# Patient Record
Sex: Female | Born: 1960 | Race: Black or African American | Hispanic: No | Marital: Single | Smoking: Never smoker
Health system: Southern US, Community
[De-identification: ages and names within clinical notes are randomized; demographics above are authoritative.]

## PROBLEM LIST (undated history)

## (undated) DIAGNOSIS — E039 Hypothyroidism, unspecified: Secondary | ICD-10-CM

## (undated) DIAGNOSIS — Z5189 Encounter for other specified aftercare: Secondary | ICD-10-CM

## (undated) DIAGNOSIS — T7840XA Allergy, unspecified, initial encounter: Secondary | ICD-10-CM

## (undated) DIAGNOSIS — N912 Amenorrhea, unspecified: Secondary | ICD-10-CM

## (undated) DIAGNOSIS — E538 Deficiency of other specified B group vitamins: Secondary | ICD-10-CM

## (undated) DIAGNOSIS — Z8742 Personal history of other diseases of the female genital tract: Secondary | ICD-10-CM

## (undated) DIAGNOSIS — D219 Benign neoplasm of connective and other soft tissue, unspecified: Secondary | ICD-10-CM

## (undated) DIAGNOSIS — D649 Anemia, unspecified: Secondary | ICD-10-CM

## (undated) HISTORY — DX: Hypothyroidism, unspecified: E03.9

## (undated) HISTORY — DX: Anemia, unspecified: D64.9

## (undated) HISTORY — PX: BREAST FIBROADENOMA SURGERY: SHX580

## (undated) HISTORY — PX: DENTAL SURGERY: SHX609

## (undated) HISTORY — PX: BREAST EXCISIONAL BIOPSY: SUR124

## (undated) HISTORY — DX: Deficiency of other specified B group vitamins: E53.8

## (undated) HISTORY — DX: Amenorrhea, unspecified: N91.2

## (undated) HISTORY — DX: Allergy, unspecified, initial encounter: T78.40XA

## (undated) HISTORY — DX: Personal history of other diseases of the female genital tract: Z87.42

## (undated) HISTORY — DX: Encounter for other specified aftercare: Z51.89

## (undated) HISTORY — DX: Benign neoplasm of connective and other soft tissue, unspecified: D21.9

---

## 1988-01-01 HISTORY — PX: BREAST FIBROADENOMA SURGERY: SHX580

## 2001-06-26 ENCOUNTER — Other Ambulatory Visit: Admission: RE | Admit: 2001-06-26 | Discharge: 2001-06-26 | Payer: Self-pay | Admitting: Obstetrics and Gynecology

## 2004-01-11 ENCOUNTER — Other Ambulatory Visit: Admission: RE | Admit: 2004-01-11 | Discharge: 2004-01-11 | Payer: Self-pay | Admitting: Obstetrics and Gynecology

## 2004-01-19 ENCOUNTER — Ambulatory Visit (HOSPITAL_COMMUNITY): Admission: RE | Admit: 2004-01-19 | Discharge: 2004-01-19 | Payer: Self-pay | Admitting: Obstetrics and Gynecology

## 2005-02-06 ENCOUNTER — Other Ambulatory Visit: Admission: RE | Admit: 2005-02-06 | Discharge: 2005-02-06 | Payer: Self-pay | Admitting: Obstetrics and Gynecology

## 2005-02-09 ENCOUNTER — Ambulatory Visit (HOSPITAL_COMMUNITY): Admission: RE | Admit: 2005-02-09 | Discharge: 2005-02-09 | Payer: Self-pay | Admitting: Obstetrics and Gynecology

## 2006-02-11 ENCOUNTER — Ambulatory Visit (HOSPITAL_COMMUNITY): Admission: RE | Admit: 2006-02-11 | Discharge: 2006-02-11 | Payer: Self-pay | Admitting: Obstetrics and Gynecology

## 2006-02-20 ENCOUNTER — Other Ambulatory Visit: Admission: RE | Admit: 2006-02-20 | Discharge: 2006-02-20 | Payer: Self-pay | Admitting: Obstetrics and Gynecology

## 2006-03-07 ENCOUNTER — Encounter: Admission: RE | Admit: 2006-03-07 | Discharge: 2006-03-07 | Payer: Self-pay | Admitting: Obstetrics and Gynecology

## 2007-02-07 ENCOUNTER — Observation Stay (HOSPITAL_COMMUNITY): Admission: AD | Admit: 2007-02-07 | Discharge: 2007-02-08 | Payer: Self-pay | Admitting: Obstetrics and Gynecology

## 2007-02-25 DIAGNOSIS — D649 Anemia, unspecified: Secondary | ICD-10-CM | POA: Insufficient documentation

## 2007-04-25 ENCOUNTER — Encounter: Admission: RE | Admit: 2007-04-25 | Discharge: 2007-04-25 | Payer: Self-pay | Admitting: Obstetrics and Gynecology

## 2009-03-22 ENCOUNTER — Encounter: Admission: RE | Admit: 2009-03-22 | Discharge: 2009-03-22 | Payer: Self-pay | Admitting: Obstetrics and Gynecology

## 2010-03-15 DIAGNOSIS — N939 Abnormal uterine and vaginal bleeding, unspecified: Secondary | ICD-10-CM | POA: Insufficient documentation

## 2010-03-31 ENCOUNTER — Encounter: Admission: RE | Admit: 2010-03-31 | Discharge: 2010-03-31 | Payer: Self-pay | Admitting: Obstetrics and Gynecology

## 2010-04-12 ENCOUNTER — Encounter: Admission: RE | Admit: 2010-04-12 | Discharge: 2010-04-12 | Payer: Self-pay | Admitting: Obstetrics and Gynecology

## 2010-10-13 ENCOUNTER — Encounter: Admission: RE | Admit: 2010-10-13 | Discharge: 2010-10-13 | Payer: Self-pay | Admitting: Obstetrics and Gynecology

## 2011-01-21 ENCOUNTER — Encounter: Payer: Self-pay | Admitting: Obstetrics and Gynecology

## 2011-03-20 DIAGNOSIS — N912 Amenorrhea, unspecified: Secondary | ICD-10-CM | POA: Insufficient documentation

## 2011-03-20 DIAGNOSIS — Z8742 Personal history of other diseases of the female genital tract: Secondary | ICD-10-CM | POA: Insufficient documentation

## 2011-05-18 NOTE — Discharge Summary (Signed)
Theresa [name], Theresa [name]              ACCOUNT NO.:  [id number]   MEDICAL RECORD NO.:  [id number]          PATIENT TYPE:  OBV   LOCATION:  9399                          [facility]ILITY[facility]  [facility]   PHYSICIAN:  Hal Morales, M.D.DATE OF BIRTH:  Sep 17, 1961   DATE OF ADMISSION:  02/07/2007  DATE OF DISCHARGE:  02/08/2007                               DISCHARGE SUMMARY   DISCHARGE DIAGNOSES:  1. Menorrhagia.  2. Uterine fibroids.  3. Severe anemia (hemoglobin as low as 4).  4. s/p transfusion  5. s/p attempted Novasure endometrial ablation   OPERATION:  On hospital day #2, the patient underwent hysteroscopy with  an attempted endometrial ablation which could not be completed.  Due to  inability to document the integrity of the endometrial cavity upon the  use of the NovaSure Endometrial Ablation System, the procedure was  terminated.   HISTORY OF PRESENT ILLNESS:  Theresa [name] is a 50 year old single black  female para 1-0-0-1 with a history of uterine fibroids, menorrhagia and  severe anemia (on admission hemoglobin of 4) who presents for  hysteroscopy with endometrial ablation and transfusion of 4 units of  packed red blood cells.  Please see the patient's dictated history and  physical examination for details.   ADMISSION PHYSICAL EXAM:  GENERAL EXAM:  The patient was a well  developed black female in no acute distress.  General exam was within  normal limits, however, do note that the patient's heart exam revealed  tachycardia at a rate of 102 with a 2/6 systolic ejection murmur.  She  had no S3 heart sounds.  PELVIC:  EG, BUS was covered with blood.  The vagina had approximately  30 mL of blood in the vault with several clots.  The cervix was  nontender without lesions.  The uterus appeared upper limits of normal  size, posterior and firm.  Adnexa was without any tenderness or masses  and the rectovaginal exam was deferred.   HOSPITAL COURSE:  On the date of admission, the patient was  transfused 4  units of packed red blood cells bringing her hemoglobin of 4 to 8.4.   On hospital day #2, she underwent hysteroscopy with an attempted  NovaSure ablation procedure, however, it was terminated due to inability  to confirm uterine integrity.  The remainder of the patient's hospital  visit and postop period was unremarkable with patient being discharged  home on the afternoon of hospital day #2.   DISCHARGE MEDICATIONS:  1. Ibuprofen 600 mg every 6 hours as needed for pain.  2. Ferrous sulfate 1 tablet twice daily.  3. Ortho-Novum 1/35 one tablet daily.   FOLLOW UP:  The patient is to call [facility] to schedule a  postoperative visit with Dr. Pennie Rushing in 10-14 days.   DISCHARGE INSTRUCTIONS:  The patient the patient was given a copy of the  [facility] Instructions for Hysteroscopy.  She was also advised to avoid intercourse for 2 weeks.  The patient's  diet was to eat light diet on the evening of her discharge and resume a  regular diet the  following day.      Theresa [name].      Hal Morales, M.D.  Electronically Signed    EJP/MEDQ  D:  02/20/2007  T:  02/20/2007  Job:  [id number]

## 2011-05-18 NOTE — H&P (Signed)
[name], [name]              ACCOUNT NO.:  [id number]   MEDICAL RECORD NO.:  [id number]          PATIENT TYPE:  INP   LOCATION:  9310                          [facility]   PHYSICIAN:  Hal Morales, M.D.DATE OF BIRTH:  1961/03/21   DATE OF ADMISSION:  02/07/2007  DATE OF DISCHARGE:                              HISTORY & PHYSICAL   HISTORY OF PRESENT ILLNESS:  The patient is a 50 year old black, single  female para 1-0-0-1 who presented to [facility]  [facility] on February 06, 2007 complaining of  increasingly severe menorrhagia.  The patient had had normal cycles  through July, 2007.  She did not have menses in August.  She had a  normal menstrual cycle, which, for her, consists of 3 days of bleeding  in September.  In October she had no menses.  In November she had  bleeding from November 17 through December 24, off and on, sometimes  heavy with clots, sometimes changing pads as much as every 2 hours.  She  restarted bleeding on January 02, 2007 and continues bleeding through  today.  Again, the flow varies from light to fairly heavy.  Over the  last several days, it had been fairly light but on February 06, 2007 was  noted to be quite significant and she began feeling weak.  She called  and was seen for a same-day appointment.  At that time, she denied  dizziness.  She denied nausea, vomiting, constipation or diarrhea.  She  denied any specific pelvic pain although she was having rare menstrual  type cramps.  She is not sexually active and has not been for several  years.  She admits to a history of anemia in the past but states that  she checked her hemoglobin at the [facility] approximately a week ago and  it was read as 9 g of hemoglobin.  Her last hemoglobin in our office was  in November, 2005, at which time it was 12 g.  The patient does have a  gynecologic history of uterine fibroids and these have always been  asymptomatic.  The diagnosis was made in 2005.   MEDICAL HISTORY:  The patient does have hypothyroidism, for which she is  followed by Dr. Margaretmary Bayley and is currently taking Synthroid.  She  has had no change in her medication in the last year and her last TSH  was approximately a year ago.  She specifically denies fatigue prior to  her bleeding episode; however, she does now admit to fatigue.  She  denies any significant weight gain, leg swelling or constipation.  She  denies a history of diabetes or hypertension.   CURRENT MEDICATIONS:  1. Synthroid 150 mcg a day.  2. Iron 325 mg a day.   DRUG SENSITIVITIES:  No known drug allergies.   FAMILY HISTORY:  Positive for heart disease, epilepsy, chronic  hypertension, anemia, breast cancer.   PAST SURGICAL HISTORY:  1. Cesarean section in 1999.  2. Colonoscopy in 2000.  3. Removal of a right fibroadenoma in 1989.  GYNECOLOGIC PAST HISTORY:  The patient had an abnormal Pap smear in 1989  and underwent cold-knife conization of the cervix with subsequent normal  Pap smears.  Her last Pap smear was done in February, 2007 and was  normal.   REVIEW OF SYSTEMS:  Negative except as mentioned above.  Specifically,  the patient does not have any chest pain, shortness of breath,  palpitations, syncope or difficulty ambulating.   PHYSICAL EXAMINATION:  GENERAL:  The patient is a well-developed black  female in no acute distress.  HEENT:  Within normal limits.  The thyroid is not enlarged.  HEART:  Tachycardic with a resting heart rate of approximately 102 with  no significant orthostatic increase with sitting or standing.  She does  have a 2/6 systolic ejection murmur.  She has no S3.  LUNGS:  Clear.  ABDOMEN:  Soft without masses or organomegaly.  PELVIC:  External genitalia, Bartholin, urethra and Skene's are blood  covered.  The vagina has approximately 30 mL of blood in the vault with  several clots.  The cervix is nontender  without lesions.  The uterus is  upper limits of normal size, posterior and firm.  Adnexa, no masses or  tenderness.  RECTOVAGINAL:  Deferred.   LABORATORY STUDIES:  Urine analysis is negative except for blood.  Pregnancy test is negative.  Fingerstick hemoglobin initially read 3.4 g  and on second evaluation read 4 g.  Complete blood count confirmed the  hemoglobin of 4 g.  Ultrasound shows a uterus measuring 11. 9 cm x 6.3 x  8.1 cm.  The right and left ovaries are within normal limits.  The  endometrial thickness is 1.9 cm.  There is suggestion of a hyper-echoic  mass in the endometrial cavity with a single blood flow consistent with  a polyp measuring 1.5 x 1.2 cm.  There is a posterior intra-mural  fibroid measuring 3 x 2.4 x 2.4 cm.   IMPRESSION:  1. Menorrhagia.  2. Uterine fibroid.  3  Possible endometrial polyp.  1. Hypothyroidism.  2. Well-compensated anemia.   DISPOSITION:  1. A discussion was held with the patient concerning options for      management.  Transfusion is recommended and the patient accepts      transfusion and admission on 02/07/2007.  She will have a 4 unit      transfusion of packed red blood cells.  2. Premarin 25 mg IV q. 6 hours until bleeding stops.  3. Recommendation for D&C, possible endometrial ablation is offered      with the risks of anesthesia, bleeding, infection, damage to      adjacent organs and the possibility that because of her anatomic      defect she may not get optimal benefit from her endometrial      ablation.  She is likewise offered Mirena IUD, Depo-Provera and      hysterectomy.  The patient seems most interested in either      endometrial ablation or Mirena and will make a final decision about      that.  She did undergo an endometrial biopsy and that pathology is      pending.      Hal Morales, M.D.  Electronically Signed    VPH/MEDQ  D:  02/07/2007  T:  02/07/2007  Job:  [id number]

## 2011-05-18 NOTE — Op Note (Signed)
[name], [name]              ACCOUNT NO.:  [id number]   MEDICAL RECORD NO.:  [id number]          PATIENT TYPE:  INP   LOCATION:  9310                          [facility]ILITY:  [facility]   PHYSICIAN:  Hal Morales, M.D.DATE OF BIRTH:  10/12/1961   DATE OF PROCEDURE:  02/08/2007  DATE OF DISCHARGE:                               OPERATIVE REPORT   PREOPERATIVE DIAGNOSES:  Menorrhagia, abnormal uterine bleeding, uterine  fibroids, anemia, question of endometrial polyp.   POSTOPERATIVE DIAGNOSES:  Menorrhagia, abnormal uterine bleeding, anemia  and fibroid.   PROCEDURE:  Hysteroscopy, attempted endometrial ablation which could not  be was completed.   SURGEON:  Hal Morales, M.D.   ANESTHESIA:  General LMA.   ESTIMATED BLOOD LOSS:  Less than 25 mL.   COMPLICATIONS:  Due inability to document the integrity of the  endometrial cavity upon use of the Novasure, the endometrial ablation,  could not be completed.   DESCRIPTION OF PROCEDURE:  The patient was taken to the operating room,  after appropriate identification, placed on the operating table.  After  the attainment of adequate general anesthesia, she was placed in the  lithotomy position.  The perineum and vagina were prepped with multiple  layers of Betadine and a red Robinson catheter used to empty the  bladder.  The perineum was draped as a sterile field.  A Graves speculum  was placed in the vagina and a paracervical block achieved with a total  of 10 mL of 2% Xylocaine in the 5 and 7 o'clock positions.  The cervix  was grasped with a single tooth tenaculum and the cervix measured at 4  cm.  It was, however, noted that the cervix was quite dilated consistent  with the patient's history of bleeding for the last two months.  The  uterus sounded to 9 cm for a cavity length of 5 cm.  No further dilation  was required to utilize the diagnostic hysteroscope to visualize the  endometrial cavity.  The tubal ostia were  identified and the endometrium  was noted to be quite atrophic.  No endometrial lesions were noted,  specifically, there were no polyps.   The hysteroscope was then removed and the Novasure apparatus placed in  the endometrial cavity.  The array was deployed and upon seating the  array, it was noted that the cervix was dilated enough that the array  could not be held in the endometrial cavity.  Several maneuvers were  attempted to adequately close the cervical canal to allow placement of  the Novasure device, seating of the Novasure device, and then passage of  the cavity assessment.  In spite of additional tenacula placed on the  cervix, stitches placed in the cervix, and the use of Vaseline gauze to  try to create a seal at the cervix the cavity assessment could never be  passed.  In light of inability to document integrity of the cavity by  passing the cavity assessment, the procedure was abandoned and no  further global endometrial ablation was attempted.   All instruments were then removed from the vagina.  The  sutures that had  been placed in the cervix as a pursestring were removed and hemostasis  was noted to be adequate.  The patient was awakened from general  anesthesia, after  all instruments had been removed, and taken to the recovery room in  satisfactory condition having tolerated the procedure well with sponge  and instrument counts correct.  There were no specimens to pathology as  the patient had undergone an endometrial biopsy in the office.      Hal Morales, M.D.  Electronically Signed     VPH/MEDQ  D:  02/08/2007  T:  02/08/2007  Job:  [id number]

## 2012-02-20 DIAGNOSIS — N939 Abnormal uterine and vaginal bleeding, unspecified: Secondary | ICD-10-CM

## 2012-02-20 DIAGNOSIS — D649 Anemia, unspecified: Secondary | ICD-10-CM

## 2012-02-20 DIAGNOSIS — N912 Amenorrhea, unspecified: Secondary | ICD-10-CM

## 2012-02-20 DIAGNOSIS — Z8742 Personal history of other diseases of the female genital tract: Secondary | ICD-10-CM

## 2012-02-20 DIAGNOSIS — E538 Deficiency of other specified B group vitamins: Secondary | ICD-10-CM

## 2012-04-03 ENCOUNTER — Ambulatory Visit (INDEPENDENT_AMBULATORY_CARE_PROVIDER_SITE_OTHER): Payer: Commercial Indemnity | Admitting: Obstetrics and Gynecology

## 2012-04-03 DIAGNOSIS — Z01419 Encounter for gynecological examination (general) (routine) without abnormal findings: Secondary | ICD-10-CM

## 2012-04-03 DIAGNOSIS — N920 Excessive and frequent menstruation with regular cycle: Secondary | ICD-10-CM

## 2012-04-17 ENCOUNTER — Telehealth: Payer: Self-pay

## 2012-04-17 NOTE — Telephone Encounter (Signed)
PC TO PT PER TEST RESULTS. TOLD PT TSH-WNL. HGB 10.8. ADVISED TO TAKE IRON 325MG  DAILY PER VPH.  PT VOICES UNDERSTANDING.

## 2012-04-25 ENCOUNTER — Other Ambulatory Visit: Payer: Self-pay | Admitting: Obstetrics and Gynecology

## 2012-04-25 DIAGNOSIS — D249 Benign neoplasm of unspecified breast: Secondary | ICD-10-CM

## 2012-04-29 ENCOUNTER — Ambulatory Visit
Admission: RE | Admit: 2012-04-29 | Discharge: 2012-04-29 | Disposition: A | Discharge status: Home / Self Care | Payer: Commercial Indemnity | Source: Ambulatory Visit | Attending: Obstetrics and Gynecology | Admitting: Obstetrics and Gynecology

## 2012-04-29 ENCOUNTER — Other Ambulatory Visit: Payer: Self-pay | Admitting: Obstetrics and Gynecology

## 2012-04-29 DIAGNOSIS — D249 Benign neoplasm of unspecified breast: Secondary | ICD-10-CM

## 2012-05-07 NOTE — Progress Notes (Signed)
Tc to pt per imaging results. Pt has decided to monitor breast mass at this time as rec per report. Pt has already spoken to radiologist about her decision.

## 2013-12-21 ENCOUNTER — Other Ambulatory Visit: Payer: Self-pay | Admitting: Obstetrics and Gynecology

## 2013-12-21 DIAGNOSIS — D249 Benign neoplasm of unspecified breast: Secondary | ICD-10-CM

## 2013-12-30 ENCOUNTER — Other Ambulatory Visit: Payer: Commercial Indemnity

## 2017-05-02 ENCOUNTER — Other Ambulatory Visit: Payer: Self-pay | Admitting: Obstetrics and Gynecology

## 2017-05-02 DIAGNOSIS — R921 Mammographic calcification found on diagnostic imaging of breast: Secondary | ICD-10-CM

## 2017-05-06 ENCOUNTER — Other Ambulatory Visit: Payer: Self-pay | Admitting: Obstetrics and Gynecology

## 2017-05-06 ENCOUNTER — Ambulatory Visit
Admission: RE | Admit: 2017-05-06 | Discharge: 2017-05-06 | Disposition: A | Discharge status: Home / Self Care | Payer: Commercial Indemnity | Source: Ambulatory Visit | Attending: Obstetrics and Gynecology | Admitting: Obstetrics and Gynecology

## 2017-05-06 DIAGNOSIS — R921 Mammographic calcification found on diagnostic imaging of breast: Secondary | ICD-10-CM

## 2018-04-28 ENCOUNTER — Other Ambulatory Visit: Payer: Self-pay | Admitting: Obstetrics and Gynecology

## 2018-04-28 DIAGNOSIS — Z1231 Encounter for screening mammogram for malignant neoplasm of breast: Secondary | ICD-10-CM

## 2018-05-16 ENCOUNTER — Ambulatory Visit
Admission: RE | Admit: 2018-05-16 | Discharge: 2018-05-16 | Disposition: A | Discharge status: Home / Self Care | Payer: Managed Care, Other (non HMO) | Source: Ambulatory Visit | Attending: Obstetrics and Gynecology | Admitting: Obstetrics and Gynecology

## 2018-05-16 DIAGNOSIS — Z1231 Encounter for screening mammogram for malignant neoplasm of breast: Secondary | ICD-10-CM

## 2018-05-19 ENCOUNTER — Other Ambulatory Visit: Payer: Self-pay | Admitting: Obstetrics and Gynecology

## 2018-05-19 DIAGNOSIS — R928 Other abnormal and inconclusive findings on diagnostic imaging of breast: Secondary | ICD-10-CM

## 2018-05-21 ENCOUNTER — Ambulatory Visit
Admission: RE | Admit: 2018-05-21 | Discharge: 2018-05-21 | Disposition: A | Discharge status: Home / Self Care | Payer: Managed Care, Other (non HMO) | Source: Ambulatory Visit | Attending: Obstetrics and Gynecology | Admitting: Obstetrics and Gynecology

## 2018-05-21 DIAGNOSIS — R928 Other abnormal and inconclusive findings on diagnostic imaging of breast: Secondary | ICD-10-CM

## 2022-04-19 DIAGNOSIS — Z1231 Encounter for screening mammogram for malignant neoplasm of breast: Secondary | ICD-10-CM

## 2022-04-23 ENCOUNTER — Encounter: Payer: Self-pay | Admitting: Internal Medicine

## 2022-04-23 ENCOUNTER — Ambulatory Visit: Payer: BC Managed Care – PPO | Admitting: Internal Medicine

## 2022-04-23 ENCOUNTER — Encounter: Payer: Self-pay | Admitting: Gastroenterology

## 2022-04-23 VITALS — BP 130/90 | HR 67 | Temp 97.6°F | Ht 66.0 in | Wt 217.9 lb

## 2022-04-23 DIAGNOSIS — Z1211 Encounter for screening for malignant neoplasm of colon: Secondary | ICD-10-CM

## 2022-04-23 DIAGNOSIS — R030 Elevated blood-pressure reading, without diagnosis of hypertension: Secondary | ICD-10-CM

## 2022-04-23 DIAGNOSIS — Z6835 Body mass index (BMI) 35.0-35.9, adult: Secondary | ICD-10-CM

## 2022-04-23 DIAGNOSIS — E6609 Other obesity due to excess calories: Secondary | ICD-10-CM | POA: Diagnosis not present

## 2022-04-23 DIAGNOSIS — Z23 Encounter for immunization: Secondary | ICD-10-CM | POA: Diagnosis not present

## 2022-04-23 DIAGNOSIS — Z1231 Encounter for screening mammogram for malignant neoplasm of breast: Secondary | ICD-10-CM | POA: Diagnosis not present

## 2022-04-23 NOTE — Progress Notes (Signed)
? ? ?New Patient Office Visit ? ? ? ? ?This visit occurred during the SARS-CoV-2 public health emergency.  Safety protocols were in place, including screening questions prior to the visit, additional usage of staff PPE, and extensive cleaning of exam room while observing appropriate contact time as indicated for disinfecting solutions.  ? ? ?CC/Reason for Visit: Establish care ?Previous PCP: Jeanann Lewandowsky ?Last Visit: Years ? ?HPI: Theresa [name] is a 61 y.o. female who is coming in today for the above mentioned reasons. Past Medical History is significant for: Iron deficiency anemia due to uterine fibroids that has resolved since she has become menopausal.  She feels well and has no acute concerns or complaints.  She has a 28 year old daughter.  She does not smoke, she drinks alcohol only occasionally, no known drug allergies, her past surgical history is significant for a cesarean section and a left breast lumpectomy years ago.  Her family history significant for a father with hypertension.  She states that at one time she was diagnosed with hypothyroidism but has not been on medication in over 15 years as her thyroid levels had normalized.  She is overdue for COVID booster, flu, Tdap, shingles.  She is overdue for all age-appropriate cancer screening.  2 separate in office blood pressure measurements are 140/100 and 130/90 today. ? ? ?Past Medical/Surgical History: ?Past Medical History:  ?Diagnosis Date  ? Amenorrhea   ? hx of  ? Anemia   ? Fibroid   ? uterine, asymptomatic  ? H/O menorrhagia   ? Hypothyroidism   ? Vitamin B 12 deficiency   ? ? ?Past Surgical History:  ?Procedure Laterality Date  ? BREAST EXCISIONAL BIOPSY Left   ? [place]  ? right side  ? BREAST FIBROADENOMA SURGERY    ? [place]  ? ? ?Social History: ? reports that she has never smoked. She does not have any smokeless tobacco history on file. She reports current alcohol use. She reports that she does  not use drugs. ? ?Allergies: ?No Known Allergies ? ?Family History:  ?Family History  ?Problem Relation Age of Onset  ? Breast cancer Maternal Grandmother   ? ? ?No current outpatient medications on file. ? ?Review of Systems:  ?Constitutional: Denies fever, chills, diaphoresis, appetite change and fatigue.  ?HEENT: Denies photophobia, eye pain, redness, hearing loss, ear pain, congestion, sore throat, rhinorrhea, sneezing, mouth sores, trouble swallowing, neck pain, neck stiffness and tinnitus.   ?Respiratory: Denies SOB, DOE, cough, chest tightness,  and wheezing.   ?Cardiovascular: Denies chest pain, palpitations and leg swelling.  ?Gastrointestinal: Denies nausea, vomiting, abdominal pain, diarrhea, constipation, blood in stool and abdominal distention.  ?Genitourinary: Denies dysuria, urgency, frequency, hematuria, flank pain and difficulty urinating.  ?Endocrine: Denies: hot or cold intolerance, sweats, changes in hair or nails, polyuria, polydipsia. ?Musculoskeletal: Denies myalgias, back pain, joint swelling, arthralgias and gait problem.  ?Skin: Denies pallor, rash and wound.  ?Neurological: Denies dizziness, seizures, syncope, weakness, light-headedness, numbness and headaches.  ?Hematological: Denies adenopathy. Easy bruising, personal or family bleeding history  ?Psychiatric/Behavioral: Denies suicidal ideation, mood changes, confusion, nervousness, sleep disturbance and agitation ? ? ? ?Physical Exam: ?Vitals:  ? 04/23/22 1332 04/23/22 1339  ?BP: (!) 140/100 130/90  ?Pulse: 67   ?Temp: 97.6 ?F (36.4 ?C)   ?TempSrc: Oral   ?SpO2: 97%   ?Weight: 217 lb 14.4 oz (98.8 kg)   ?Height: '5\' 6"'$  (1.676 m)   ? ?Body mass index is 35.17 kg/m?. ? ? ?  Constitutional: NAD, calm, comfortable ?Eyes: PERRL, lids and conjunctivae normal, wears corrective lenses ?ENMT: Mucous membranes are moist.  ?Respiratory: clear to auscultation bilaterally, no wheezing, no crackles. Normal respiratory effort. No accessory muscle use.   ?Cardiovascular: Regular rate and rhythm, no murmurs / rubs / gallops. No extremity edema.  ?Neurologic: Grossly intact and nonfocal ?Psychiatric: Normal judgment and insight. Alert and oriented x 3. Normal mood.  ? ? ?Impression and Plan: ? ?Class 2 obesity due to excess calories without serious comorbidity with body mass index (BMI) of 35.0 to 35.9 in adult ?-Discussed healthy lifestyle, including increased physical activity and better food choices to promote weight loss. ? ?Screening for malignant neoplasm of colon ? - Plan: Ambulatory referral to Gastroenterology ? ?Encounter for screening mammogram for malignant neoplasm of breast ? - Plan: MM Digital Screening ? ?Elevated BP without diagnosis of hypertension ?-Blood pressure has been elevated in 2 separate measurements, she is not known to be hypertensive.  She will do ambulatory blood pressure monitoring and return for follow-up. ? ?Need for shingles vaccine ?-For shingles vaccine in office today. ? ?Time spent: 31 minutes reviewing chart, interviewing and examining patient and formulating plan of care. ? ? ? ?Patient Instructions  ?-Nice seeing you today!! ? ?-Schedule physical in 3 months. Come in fasting that day. ? ?-first shingles vaccine today. ? ?-Check your BP 2-3 times a week and bring in measurements to your next visit. ? ? ? ?Lelon Frohlich, MD ?[facility] ? ?

## 2022-04-23 NOTE — Addendum Note (Signed)
Addended byEncarnacion Slates on: 04/23/2022 02:09 PM ? ? Modules accepted: Orders ? ?

## 2022-04-23 NOTE — Patient Instructions (Signed)
-  Nice seeing you today!! ? ?-Schedule physical in 3 months. Come in fasting that day. ? ?-first shingles vaccine today. ? ?-Check your BP 2-3 times a week and bring in measurements to your next visit. ?

## 2022-04-30 ENCOUNTER — Ambulatory Visit
Admission: RE | Admit: 2022-04-30 | Discharge: 2022-04-30 | Disposition: A | Discharge status: Home / Self Care | Payer: BC Managed Care – PPO | Source: Ambulatory Visit | Attending: Internal Medicine | Admitting: Internal Medicine

## 2022-04-30 DIAGNOSIS — Z1231 Encounter for screening mammogram for malignant neoplasm of breast: Secondary | ICD-10-CM

## 2022-05-02 ENCOUNTER — Other Ambulatory Visit: Payer: Self-pay | Admitting: Internal Medicine

## 2022-05-02 DIAGNOSIS — R921 Mammographic calcification found on diagnostic imaging of breast: Secondary | ICD-10-CM

## 2022-05-12 ENCOUNTER — Other Ambulatory Visit: Payer: Self-pay | Admitting: Internal Medicine

## 2022-05-12 ENCOUNTER — Ambulatory Visit
Admission: RE | Admit: 2022-05-12 | Discharge: 2022-05-12 | Disposition: A | Discharge status: Home / Self Care | Payer: BC Managed Care – PPO | Source: Ambulatory Visit | Attending: Internal Medicine | Admitting: Internal Medicine

## 2022-05-12 DIAGNOSIS — N6489 Other specified disorders of breast: Secondary | ICD-10-CM | POA: Diagnosis not present

## 2022-05-12 DIAGNOSIS — N6320 Unspecified lump in the left breast, unspecified quadrant: Secondary | ICD-10-CM

## 2022-05-12 DIAGNOSIS — R921 Mammographic calcification found on diagnostic imaging of breast: Secondary | ICD-10-CM

## 2022-05-14 ENCOUNTER — Ambulatory Visit
Admission: RE | Admit: 2022-05-14 | Discharge: 2022-05-14 | Disposition: A | Discharge status: Home / Self Care | Payer: BC Managed Care – PPO | Source: Ambulatory Visit | Attending: Internal Medicine | Admitting: Internal Medicine

## 2022-05-14 DIAGNOSIS — N6320 Unspecified lump in the left breast, unspecified quadrant: Secondary | ICD-10-CM

## 2022-05-14 DIAGNOSIS — D0512 Intraductal carcinoma in situ of left breast: Secondary | ICD-10-CM | POA: Diagnosis not present

## 2022-05-14 DIAGNOSIS — N6321 Unspecified lump in the left breast, upper outer quadrant: Secondary | ICD-10-CM | POA: Diagnosis not present

## 2022-05-14 HISTORY — PX: BREAST BIOPSY: SHX20

## 2022-05-21 ENCOUNTER — Ambulatory Visit (AMBULATORY_SURGERY_CENTER): Payer: BC Managed Care – PPO | Admitting: *Deleted

## 2022-05-21 VITALS — Ht 66.0 in | Wt 209.0 lb

## 2022-05-21 DIAGNOSIS — Z1211 Encounter for screening for malignant neoplasm of colon: Secondary | ICD-10-CM

## 2022-05-21 MED ORDER — NA SULFATE-K SULFATE-MG SULF 17.5-3.13-1.6 GM/177ML PO SOLN: 1.0000 | Freq: Once | ORAL | 0 refills | Status: AC

## 2022-05-21 NOTE — Progress Notes (Signed)
No egg or soy allergy known to patient  No issues known to pt with past sedation with any surgeries or procedures Patient denies ever being told they had issues or difficulty with intubation  No FH of Malignant Hyperthermia Pt is not on diet pills Pt is not on  home 02  Pt is not on blood thinners  Pt denies issues with constipation  No A fib or A flutter    NO PA's for preps discussed with pt In PV today  Discussed with pt there will be an out-of-pocket cost for prep and that varies from $0 to 70 +  dollars - pt verbalized understanding  Pt instructed to use Singlecare.com or GoodRx for a price reduction on prep   PV completed over the phone. Pt verified name, DOB, address and insurance during PV today.   Pt encouraged to call with questions or issues.   If pt has My chart, procedure instructions sent via My Chart   Insurance confirmed with pt at [facility] today

## 2022-06-06 ENCOUNTER — Encounter: Payer: Self-pay | Admitting: Gastroenterology

## 2022-06-11 ENCOUNTER — Ambulatory Visit (AMBULATORY_SURGERY_CENTER): Payer: BC Managed Care – PPO | Admitting: Gastroenterology

## 2022-06-11 ENCOUNTER — Encounter: Payer: Self-pay | Admitting: Gastroenterology

## 2022-06-11 VITALS — BP 130/74 | HR 58 | Temp 96.2°F | Resp 14 | Ht 66.0 in | Wt 209.0 lb

## 2022-06-11 DIAGNOSIS — Z1211 Encounter for screening for malignant neoplasm of colon: Secondary | ICD-10-CM | POA: Diagnosis not present

## 2022-06-11 MED ORDER — SODIUM CHLORIDE 0.9 % IV SOLN: 500.0000 mL | Freq: Once | INTRAVENOUS | Status: DC

## 2022-06-11 NOTE — Progress Notes (Signed)
   Referring Provider: Isaac Bliss, [facility]* Primary Care Physician:  Isaac Bliss, Rayford Halsted, MD  Indication for Procedure:  Colon cancer screening   IMPRESSION:  Need for colon cancer screening Appropriate candidate for monitored anesthesia care  PLAN: Colonoscopy in the [place] today   HPI: Theresa [name] is a 61 y.o. female presents for screening colonoscopy.  No prior colonoscopy or colon cancer screening.  No baseline GI symptoms.   Mother with precancerous polyps. No other known family history of colon cancer or polyps. No family history of uterine/endometrial cancer, pancreatic cancer or gastric/stomach cancer.   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     oxyCODONE-acetaminophen (PERCOCET/ROXICET) 5-325 MG tablet Take 1 tablet by mouth every 4 (four) hours as needed. (Patient not taking: Reported on 05/21/2022)     Current Facility-Administered Medications  Medication Dose Route Frequency Provider Last Rate Last Admin   0.9 %  sodium chloride infusion  500 mL Intravenous Once Thornton Park, MD        Allergies as of 06/11/2022   (No Known Allergies)    Family  History  Problem Relation Age of Onset   Colon polyps Mother    Breast cancer Maternal Grandmother    Colon cancer Neg Hx    Esophageal cancer Neg Hx    Rectal cancer Neg Hx    Stomach cancer Neg Hx      Physical Exam: General:   Alert,  well-nourished, pleasant and cooperative in NAD Head:  Normocephalic and atraumatic. Eyes:  Sclera clear, no icterus.   Conjunctiva pink. Mouth:  No deformity or lesions.   Neck:  Supple; no masses or thyromegaly. Lungs:  Clear throughout to auscultation.   No wheezes. Heart:  Regular rate and rhythm; no murmurs. Abdomen:  Soft, non-tender, nondistended, normal bowel sounds, no rebound or guarding.  Msk:  Symmetrical. No boney deformities LAD: No inguinal or umbilical LAD Extremities:  No clubbing or edema. Neurologic:  Alert and  oriented x4;  grossly nonfocal Skin:  No obvious rash or bruise. Psych:  Alert and cooperative. Normal mood and affect.     Studies/Results: No results found.    Donnavan Covault L. Tarri Glenn, MD, MPH 06/11/2022, 8:28 AM

## 2022-06-11 NOTE — Progress Notes (Signed)
To pacu, VSS. Report to Rn.tb 

## 2022-06-11 NOTE — Patient Instructions (Signed)
Handout given on diverticulosis and high fiber diet.  Repeat colonoscopy in 5 years. Resume previous diet.    YOU HAD AN ENDOSCOPIC PROCEDURE TODAY AT [facility]:   Refer to the procedure report that was given to you for any specific questions about what was found during the examination.  If the procedure report does not answer your questions, please call your gastroenterologist to clarify.  If you requested that your care partner not be given the details of your procedure findings, then the procedure report has been included in a sealed envelope for you to review at your convenience later.  YOU SHOULD EXPECT: Some feelings of bloating in the abdomen. Passage of more gas than usual.  Walking can help get rid of the air that was put into your GI tract during the procedure and reduce the bloating. If you had a lower endoscopy (such as a colonoscopy or flexible sigmoidoscopy) you may notice spotting of blood in your stool or on the toilet paper. If you underwent a bowel prep for your procedure, you may not have a normal bowel movement for a few days.  Please Note:  You might notice some irritation and congestion in your nose or some drainage.  This is from the oxygen used during your procedure.  There is no need for concern and it should clear up in a day or so.  SYMPTOMS TO REPORT IMMEDIATELY:  Following lower endoscopy (colonoscopy or flexible sigmoidoscopy):  Excessive amounts of blood in the stool  Significant tenderness or worsening of abdominal pains  Swelling of the abdomen that is new, acute  Fever of 100F or higher   For urgent or emergent issues, a gastroenterologist can be reached at any hour by calling [phone]. Do not use MyChart messaging for urgent concerns.    DIET:  We do recommend a small meal at first, but then you may proceed to your regular diet.  Drink plenty of fluids but you should avoid alcoholic beverages for 24 hours.  ACTIVITY:  You should  plan to take it easy for the rest of today and you should NOT DRIVE or use heavy machinery until tomorrow (because of the sedation medicines used during the test).    FOLLOW UP: Our staff will call the number listed on your records 24-72 hours following your procedure to check on you and address any questions or concerns that you may have regarding the information given to you following your procedure. If we do not reach you, we will leave a message.  We will attempt to reach you two times.  During this call, we will ask if you have developed any symptoms of COVID 19. If you develop any symptoms (ie: fever, flu-like symptoms, shortness of breath, cough etc.) before then, please call [phone].  If you test positive for Covid 19 in the 2 weeks post procedure, please call and report this information to [place].    If any biopsies were taken you will be contacted by phone or by letter within the next 1-3 weeks.  Please call us at [phone] if you have not heard about the biopsies in 3 weeks.    SIGNATURES/CONFIDENTIALITY: You and/or your care partner have signed paperwork which will be entered into your electronic medical record.  These signatures attest to the fact that that the information above on your After Visit Summary has been reviewed and is understood.  Full responsibility of the confidentiality of this discharge information lies with you and/or  your care-partner.

## 2022-06-11 NOTE — Op Note (Addendum)
[place] Patient Name: Theresa [name] Procedure Date: 06/11/2022 8:54 AM MRN: [id number] Endoscopist: Thornton Park MD, MD Age: 61 Referring MD:  Date of Birth: July 25, 1961 Gender: Female Account #: [id number] Procedure:                Colonoscopy Indications:              Screening for colorectal malignant neoplasm, This                            is the patient's first colonoscopy                           Mother with precancerous polyps Medicines:                Monitored Anesthesia Care Procedure:                Pre-Anesthesia Assessment:                           - Prior to the procedure, a History and Physical                            was performed, and patient medications and                            allergies were reviewed. The patient's tolerance of                            previous anesthesia was also reviewed. The risks                            and benefits of the procedure and the sedation                            options and risks were discussed with the patient.                            All questions were answered, and informed consent                            was obtained. Prior Anticoagulants: The patient has                            taken no previous anticoagulant or antiplatelet                            agents. ASA Grade Assessment: II - A patient with                            mild systemic disease. After reviewing the risks                            and benefits, the patient was deemed in  satisfactory condition to undergo the procedure.                           After obtaining informed consent, the colonoscope                            was passed under direct vision. Throughout the                            procedure, the patient's blood pressure, pulse, and                            oxygen saturations were monitored continuously. The                            CF [id number] #[id number] was introduced  through the anus                            and advanced to the 3 cm into the ileum. A second                            forward view of the right colon was performed. The                            colonoscopy was performed without difficulty. The                            patient tolerated the procedure well. The quality                            of the bowel preparation was good. The terminal                            ileum, ileocecal valve, appendiceal orifice, and                            rectum were photographed. Scope In: 9:05:48 AM Scope Out: 9:21:36 AM Scope Withdrawal Time: 0 hours 10 minutes 46 seconds  Total Procedure Duration: 0 hours 15 minutes 48 seconds  Findings:                 The perianal and digital rectal examinations were                            normal.                           Multiple small and large-mouthed diverticula were                            found in the sigmoid colon, descending colon and                            ascending colon.  The exam was otherwise without abnormality on                            direct and retroflexion views except for internal                            hemorrhoids. Complications:            No immediate complications. Estimated Blood Loss:     Estimated blood loss: none. Impression:               - Diverticulosis in the sigmoid colon, in the                            descending colon and in the ascending colon.                           - Small internal hemorrhoids.                           - The examination was otherwise normal on direct                            and retroflexion views.                           - No specimens collected. Recommendation:           - Patient has a contact number available for                            emergencies. The signs and symptoms of potential                            delayed complications were discussed with the                             patient. Return to normal activities tomorrow.                            Written discharge instructions were provided to the                            patient.                           - Resume previous diet.                           - Continue present medications.                           - Repeat colonoscopy in 5 years for surveillance.                           - Follow a high fiber diet. Drink at least 64  ounces of water daily. Add a daily stool bulking                            agent such as psyllium (an exampled would be                            Metamucil).                           - Emerging evidence supports eating a diet of                            fruits, vegetables, grains, calcium, and yogurt                            while reducing red meat and alcohol may reduce the                            risk of colon cancer.                           - Thank you for allowing me to be involved in your                            colon cancer prevention. Thornton Park MD, MD 06/11/2022 9:26:49 AM This report has been signed electronically.

## 2022-06-11 NOTE — Progress Notes (Signed)
Pt's states no medical or surgical changes since previsit or office visit. 

## 2022-06-12 ENCOUNTER — Telehealth: Payer: Self-pay

## 2022-06-12 NOTE — Telephone Encounter (Signed)
  Follow up Call-     06/11/2022    8:05 AM  Call back number  Post procedure Call Back phone  # [phone]  Permission to leave phone message Yes     Patient questions:  Do you have a fever, pain , or abdominal swelling? No. Pain Score  0 *  Have you tolerated food without any problems? Yes.    Have you been able to return to your normal activities? Yes.    Do you have any questions about your discharge instructions: Diet   No. Medications  No. Follow up visit  No.  Do you have questions or concerns about your Care? No.  Actions: * If pain score is 4 or above: No action needed, pain <4.

## 2022-06-12 NOTE — Telephone Encounter (Signed)
  Follow up Call-     06/11/2022    8:05 AM  Call back number  Post procedure Call Back phone  # [phone]  Permission to leave phone message Yes     Patient questions:  Do you have a fever, pain , or abdominal swelling? No. Pain Score  0 *  Have you tolerated food without any problems? Yes.    Have you been able to return to your normal activities? Yes.    Do you have any questions about your discharge instructions: Diet   No. Medications  No. Follow up visit  No.  Do you have questions or concerns about your Care? No.  Actions: * If pain score is 4 or above: No action needed, pain <4.

## 2022-07-25 ENCOUNTER — Other Ambulatory Visit: Payer: Self-pay | Admitting: Internal Medicine

## 2022-07-25 ENCOUNTER — Encounter: Payer: Self-pay | Admitting: Internal Medicine

## 2022-07-25 ENCOUNTER — Other Ambulatory Visit (HOSPITAL_COMMUNITY)
Admission: RE | Admit: 2022-07-25 | Discharge: 2022-07-25 | Disposition: A | Discharge status: Home / Self Care | Payer: BC Managed Care – PPO | Source: Ambulatory Visit | Attending: Internal Medicine | Admitting: Internal Medicine

## 2022-07-25 ENCOUNTER — Ambulatory Visit (INDEPENDENT_AMBULATORY_CARE_PROVIDER_SITE_OTHER): Payer: BC Managed Care – PPO | Admitting: Internal Medicine

## 2022-07-25 VITALS — BP 110/80 | HR 79 | Temp 98.0°F | Ht 66.0 in | Wt 206.3 lb

## 2022-07-25 DIAGNOSIS — Z124 Encounter for screening for malignant neoplasm of cervix: Secondary | ICD-10-CM | POA: Insufficient documentation

## 2022-07-25 DIAGNOSIS — E1169 Type 2 diabetes mellitus with other specified complication: Secondary | ICD-10-CM

## 2022-07-25 DIAGNOSIS — E559 Vitamin D deficiency, unspecified: Secondary | ICD-10-CM | POA: Insufficient documentation

## 2022-07-25 DIAGNOSIS — Z23 Encounter for immunization: Secondary | ICD-10-CM | POA: Diagnosis not present

## 2022-07-25 DIAGNOSIS — Z0000 Encounter for general adult medical examination without abnormal findings: Secondary | ICD-10-CM | POA: Diagnosis not present

## 2022-07-25 DIAGNOSIS — E119 Type 2 diabetes mellitus without complications: Secondary | ICD-10-CM | POA: Insufficient documentation

## 2022-07-25 DIAGNOSIS — E538 Deficiency of other specified B group vitamins: Secondary | ICD-10-CM | POA: Diagnosis not present

## 2022-07-25 LAB — CBC WITH DIFFERENTIAL/PLATELET
Basophils Absolute: 0 10*3/uL (ref 0.0–0.1)
Basophils Relative: 0.6 % (ref 0.0–3.0)
Eosinophils Absolute: 0.1 10*3/uL (ref 0.0–0.7)
Eosinophils Relative: 2.3 % (ref 0.0–5.0)
HCT: 41.1 % (ref 36.0–46.0)
Hemoglobin: 13.5 g/dL (ref 12.0–15.0)
Lymphocytes Relative: 43.6 % (ref 12.0–46.0)
Lymphs Abs: 2.6 10*3/uL (ref 0.7–4.0)
MCHC: 32.8 g/dL (ref 30.0–36.0)
MCV: 88.5 fl (ref 78.0–100.0)
Monocytes Absolute: 0.3 10*3/uL (ref 0.1–1.0)
Monocytes Relative: 5.7 % (ref 3.0–12.0)
Neutro Abs: 2.9 10*3/uL (ref 1.4–7.7)
Neutrophils Relative %: 47.8 % (ref 43.0–77.0)
Platelets: 287 10*3/uL (ref 150.0–400.0)
RBC: 4.64 Mil/uL (ref 3.87–5.11)
RDW: 13 % (ref 11.5–15.5)
WBC: 6 10*3/uL (ref 4.0–10.5)

## 2022-07-25 LAB — COMPREHENSIVE METABOLIC PANEL
ALT: 10 U/L (ref 0–35)
AST: 13 U/L (ref 0–37)
Albumin: 3.8 g/dL (ref 3.5–5.2)
Alkaline Phosphatase: 87 U/L (ref 39–117)
BUN: 6 mg/dL (ref 6–23)
CO2: 30 mEq/L (ref 19–32)
Calcium: 8.9 mg/dL (ref 8.4–10.5)
Chloride: 106 mEq/L (ref 96–112)
Creatinine, Ser: 0.82 mg/dL (ref 0.40–1.20)
GFR: 78.24 mL/min (ref >60.00)
Glucose, Bld: 124 mg/dL — ABNORMAL HIGH (ref 70–99)
Potassium: 3.8 mEq/L (ref 3.5–5.1)
Sodium: 141 mEq/L (ref 135–145)
Total Bilirubin: 0.3 mg/dL (ref 0.2–1.2)
Total Protein: 7 g/dL (ref 6.0–8.3)

## 2022-07-25 LAB — TSH: TSH: 1.57 u[IU]/mL (ref 0.35–5.50)

## 2022-07-25 LAB — VITAMIN D 25 HYDROXY (VIT D DEFICIENCY, FRACTURES): VITD: 16.31 ng/mL — ABNORMAL LOW (ref 30.00–100.00)

## 2022-07-25 LAB — LIPID PANEL
Cholesterol: 201 mg/dL — ABNORMAL HIGH (ref 0–200)
HDL: 40 mg/dL (ref >39.00)
LDL Cholesterol: 140 mg/dL — ABNORMAL HIGH (ref 0–99)
NonHDL: 161.28
Total CHOL/HDL Ratio: 5
Triglycerides: 105 mg/dL (ref 0.0–149.0)
VLDL: 21 mg/dL (ref 0.0–40.0)

## 2022-07-25 LAB — VITAMIN B12: Vitamin B-12: 253 pg/mL (ref 211–911)

## 2022-07-25 LAB — HEMOGLOBIN A1C: Hgb A1c MFr Bld: 6.7 % — ABNORMAL HIGH (ref 4.6–6.5)

## 2022-07-25 MED ORDER — METFORMIN HCL 500 MG PO TABS: 500.0000 mg | ORAL_TABLET | Freq: Every day | ORAL | 1 refills | Status: AC

## 2022-07-25 MED ORDER — ATORVASTATIN CALCIUM 40 MG PO TABS: 40.0000 mg | ORAL_TABLET | Freq: Every day | ORAL | 1 refills | Status: AC

## 2022-07-25 MED ORDER — VITAMIN D (ERGOCALCIFEROL) 1.25 MG (50000 UNIT) PO CAPS: 50000.0000 [IU] | ORAL_CAPSULE | ORAL | 0 refills | Status: AC

## 2022-07-25 NOTE — Patient Instructions (Signed)
-  Nice seeing you today!!  -Lab work today; will notify you once results are available.  -Final shingles vaccine today.  -Remember COVID and tdap vaccines.  -Schedule follow up in 1 year or sooner as needed.

## 2022-07-25 NOTE — Addendum Note (Signed)
Addended by: Westley Hummer B on: 07/25/2022 07:59 AM   Modules accepted: Orders

## 2022-07-25 NOTE — Progress Notes (Signed)
Established Patient Office Visit     CC/Reason for Visit: Annual preventive exam  HPI: Theresa [name] is a 61 y.o. female who is coming in today for the above mentioned reasons. Past Medical History is significant for: Prior iron deficiency anemia due to bleeding from uterine fibroids that has resolved with menopause.  She is otherwise feeling well.  She has routine eye and dental care.  She has been walking.  She lost 13 pounds since I last saw her.  She is due for bivalent COVID, Tdap and second shingles vaccine.  She had mammogram and colonoscopy earlier this year, she is requesting Pap smear today.  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   Past Medical/Surgical History: Past Medical History:  Diagnosis Date   Allergy    mild- occ OTC meds   Amenorrhea    hx of   Anemia    Blood transfusion without reported diagnosis    3 units ~10-15 yrs ago due to fibroids   Fibroid    uterine, asymptomatic   H/O menorrhagia    Hypothyroidism    no meds- last check normal levels   Vitamin B 12 deficiency     Past Surgical History:  Procedure Laterality Date   BREAST BIOPSY  05/14/2022   mammary carcinoma in situ   BREAST EXCISIONAL BIOPSY Left    BREAST FIBROADENOMA SURGERY  1989   right side   CESAREAN SECTION  1999   DENTAL SURGERY      Social History:  reports that she has never smoked. She has never used smokeless tobacco. She reports current alcohol use. She reports that she does not use drugs.  Allergies: No Known Allergies  Family History:  Family History  Problem Relation Age of Onset   Colon polyps Mother    Breast cancer Maternal Grandmother    Colon cancer Neg Hx    Esophageal cancer Neg Hx    Rectal cancer Neg Hx    Stomach cancer Neg Hx      Current Outpatient Medications:    CALCIUM-MAGNESIUM-VITAMIN D PO, Liquid- 1 tbsp daily, Disp: , Rfl:    Multiple Vitamin (MULTIVITAMIN ADULT PO), Multivitamin, Disp: , Rfl:   Review of Systems:   Constitutional: Denies fever, chills, diaphoresis, appetite change and fatigue.  HEENT: Denies photophobia, eye pain, redness, hearing loss, ear pain, congestion, sore throat, rhinorrhea, sneezing, mouth sores, trouble swallowing, neck pain, neck stiffness and tinnitus.   Respiratory: Denies SOB, DOE, cough, chest tightness,  and wheezing.   Cardiovascular: Denies chest pain, palpitations and leg swelling.  Gastrointestinal: Denies nausea, vomiting, abdominal pain, diarrhea, constipation, blood in stool and abdominal distention.  Genitourinary: Denies dysuria, urgency, frequency, hematuria, flank pain and difficulty urinating.  Endocrine: Denies: hot or cold intolerance, sweats, changes in hair or nails, polyuria, polydipsia. Musculoskeletal: Denies myalgias, back pain, joint swelling, arthralgias and gait problem.  Skin: Denies pallor, rash and wound.  Neurological: Denies dizziness, seizures, syncope, weakness, light-headedness, numbness and headaches.  Hematological: Denies adenopathy. Easy bruising, personal or family bleeding history  Psychiatric/Behavioral: Denies suicidal ideation, mood changes, confusion, nervousness, sleep disturbance and agitation    Physical Exam: Vitals:   07/25/22 0700  BP: 110/80  Pulse: 79  Temp: 98 F (36.7 C)  TempSrc: Oral  SpO2: 99%  Weight: 206 lb 4.8 oz (93.6 kg)  Height: '5\' 6"'$  (1.676 m)    Body mass index is 33.3 kg/m.   Constitutional: NAD, calm, comfortable Eyes: PERRL, lids and conjunctivae normal  ENMT: Mucous membranes are moist. Posterior pharynx clear of any exudate or lesions. Normal dentition. Tympanic membrane is pearly white, no erythema or bulging. Neck: normal, supple, no masses, no thyromegaly Respiratory: clear to auscultation bilaterally, no wheezing, no crackles. Normal respiratory effort. No accessory muscle use.  Cardiovascular: Regular rate and rhythm, no murmurs / rubs / gallops. No extremity edema. 2+ pedal pulses. No  carotid bruits.  Abdomen: no tenderness, no masses palpated. No hepatosplenomegaly. Bowel sounds positive.  Musculoskeletal: no clubbing / cyanosis. No joint deformity upper and lower extremities. Good ROM, no contractures. Normal muscle tone.  Skin: no rashes, lesions, ulcers. No induration Neurologic: CN 2-12 grossly intact. Sensation intact, DTR normal. Strength 5/5 in all 4.  Psychiatric: Normal judgment and insight. Alert and oriented x 3. Normal mood.    Impression and Plan:  Encounter for preventive health examination  - Plan: CBC with Differential/Platelet, Comprehensive metabolic panel, Hemoglobin A1c, Lipid panel, TSH, VITAMIN D 25 Hydroxy (Vit-D Deficiency, Fractures) -Recommend routine eye and dental care. -Immunizations: Second shingles vaccine today, due for Tdap and bivalent COVID, declines today -Healthy lifestyle discussed in detail. -Labs to be updated today. 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 -Cervical cancer screening: Performed in office today -Lung cancer screening: Not applicable -Prostate cancer screening: Not applicable -DEXA: Not applicable  Vitamin P80 deficiency  - Plan: Vitamin B12  Need for shingles vaccine -Final shingles vaccine administered today     Patient Instructions  -Nice seeing you today!!  -Lab work today; will notify you once results are available.  -Final shingles vaccine today.  -Remember COVID and tdap vaccines.  -Schedule follow up in 1 year or sooner as needed.      Lelon Frohlich, MD [facility]

## 2022-07-30 LAB — CYTOLOGY - PAP
Comment: NEGATIVE
Diagnosis: NEGATIVE
High risk HPV: NEGATIVE

## 2022-09-21 ENCOUNTER — Ambulatory Visit: Payer: Self-pay | Admitting: Surgery

## 2022-09-21 DIAGNOSIS — L918 Other hypertrophic disorders of the skin: Secondary | ICD-10-CM | POA: Diagnosis not present

## 2022-09-21 DIAGNOSIS — D0502 Lobular carcinoma in situ of left breast: Secondary | ICD-10-CM | POA: Diagnosis not present

## 2022-09-27 ENCOUNTER — Other Ambulatory Visit: Payer: Self-pay | Admitting: Surgery

## 2022-09-27 DIAGNOSIS — D0500 Lobular carcinoma in situ of unspecified breast: Secondary | ICD-10-CM

## 2022-10-10 ENCOUNTER — Other Ambulatory Visit: Payer: Self-pay | Admitting: Internal Medicine

## 2022-10-10 DIAGNOSIS — E559 Vitamin D deficiency, unspecified: Secondary | ICD-10-CM

## 2022-10-13 ENCOUNTER — Ambulatory Visit
Admission: RE | Admit: 2022-10-13 | Discharge: 2022-10-13 | Disposition: A | Discharge status: Home / Self Care | Payer: BC Managed Care – PPO | Source: Ambulatory Visit | Attending: Surgery | Admitting: Surgery

## 2022-10-13 DIAGNOSIS — N6324 Unspecified lump in the left breast, lower inner quadrant: Secondary | ICD-10-CM | POA: Diagnosis not present

## 2022-10-13 DIAGNOSIS — N6323 Unspecified lump in the left breast, lower outer quadrant: Secondary | ICD-10-CM | POA: Diagnosis not present

## 2022-10-13 DIAGNOSIS — D0500 Lobular carcinoma in situ of unspecified breast: Secondary | ICD-10-CM

## 2022-10-13 MED ORDER — GADOPICLENOL 0.5 MMOL/ML IV SOLN
10.0000 mL | Freq: Once | INTRAVENOUS | Status: AC | PRN
  Administered 2022-10-13: 10 mL via INTRAVENOUS

## 2022-10-15 ENCOUNTER — Other Ambulatory Visit: Payer: Self-pay | Admitting: Internal Medicine

## 2022-10-15 DIAGNOSIS — E559 Vitamin D deficiency, unspecified: Secondary | ICD-10-CM

## 2022-10-18 ENCOUNTER — Other Ambulatory Visit: Payer: Self-pay | Admitting: Surgery

## 2022-10-18 DIAGNOSIS — R9389 Abnormal findings on diagnostic imaging of other specified body structures: Secondary | ICD-10-CM

## 2022-10-24 ENCOUNTER — Ambulatory Visit
Admission: RE | Admit: 2022-10-24 | Discharge: 2022-10-24 | Disposition: A | Discharge status: Home / Self Care | Payer: BC Managed Care – PPO | Source: Ambulatory Visit | Attending: Surgery | Admitting: Surgery

## 2022-10-24 ENCOUNTER — Other Ambulatory Visit (HOSPITAL_COMMUNITY): Payer: Self-pay | Admitting: Diagnostic Radiology

## 2022-10-24 DIAGNOSIS — N6325 Unspecified lump in the left breast, overlapping quadrants: Secondary | ICD-10-CM | POA: Diagnosis not present

## 2022-10-24 DIAGNOSIS — D0512 Intraductal carcinoma in situ of left breast: Secondary | ICD-10-CM | POA: Diagnosis not present

## 2022-10-24 DIAGNOSIS — R9389 Abnormal findings on diagnostic imaging of other specified body structures: Secondary | ICD-10-CM

## 2022-10-24 MED ORDER — GADOPICLENOL 0.5 MMOL/ML IV SOLN
10.0000 mL | Freq: Once | INTRAVENOUS | Status: AC | PRN
  Administered 2022-10-24: 10 mL via INTRAVENOUS

## 2022-10-29 ENCOUNTER — Ambulatory Visit: Payer: BC Managed Care – PPO | Admitting: Internal Medicine

## 2023-07-03 IMAGING — US US BREAST BX W LOC DEV 1ST LESION IMG BX SPEC US GUIDE*L*
1 series · 12 of 14 positions shown · non-contrast
Comparison: None Available.
COMPARISON: None Available.

Addendum:
CLINICAL DATA: Patient with an indeterminate mass in the LEFT
breast at the 2 o'clock axis presents today for ultrasound-guided
core biopsy.

EXAM:
ULTRASOUND GUIDED LEFT BREAST CORE NEEDLE BIOPSY

[Series 1: us breast bx w loc dev 1st lesion img bx spec us g · 0.07mm/px · 12 of 14 slices shown]
[im 1/14]
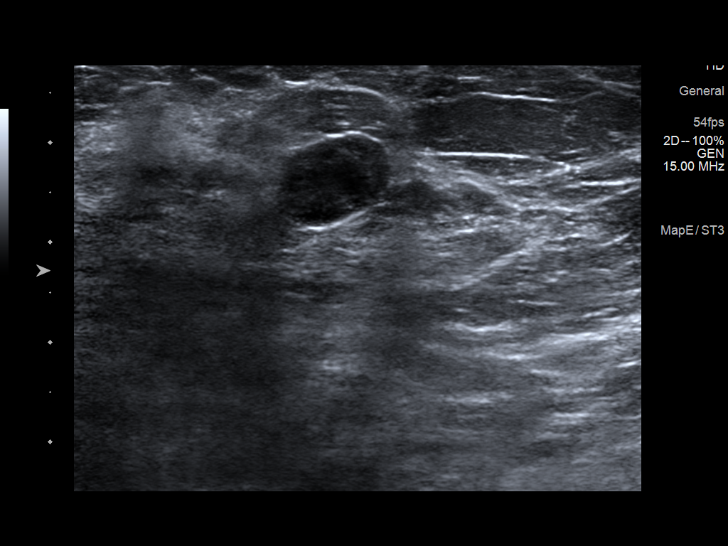
[im 2/14]
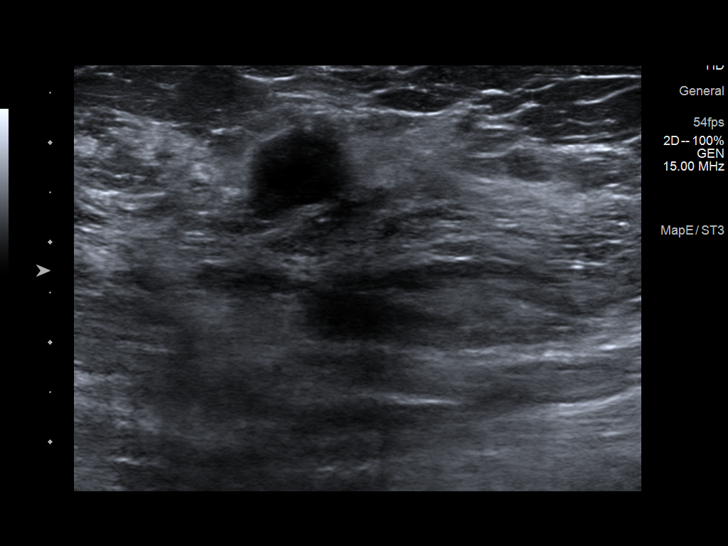
[im 3/14]
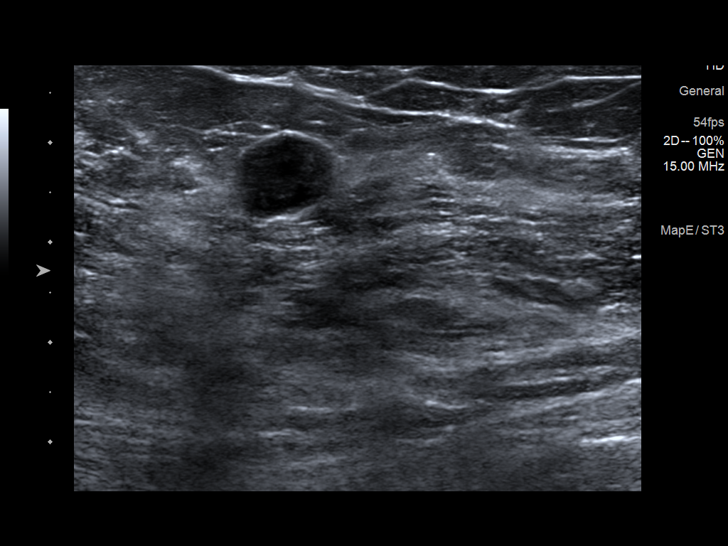
[im 5/14]
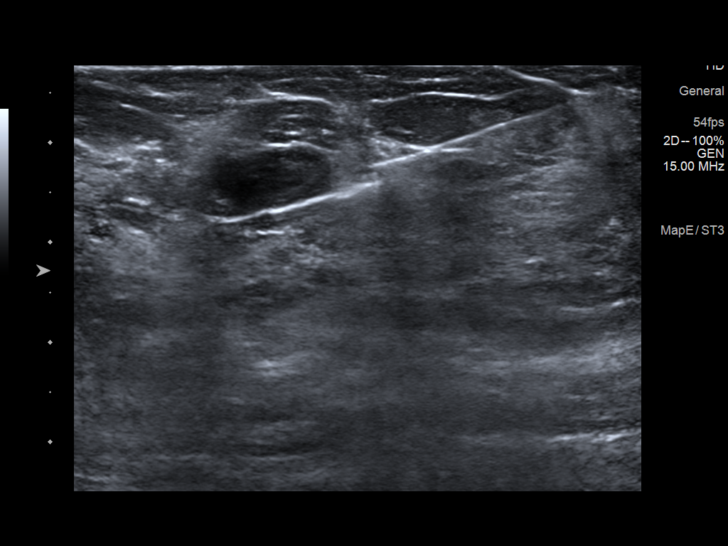
[im 6/14]
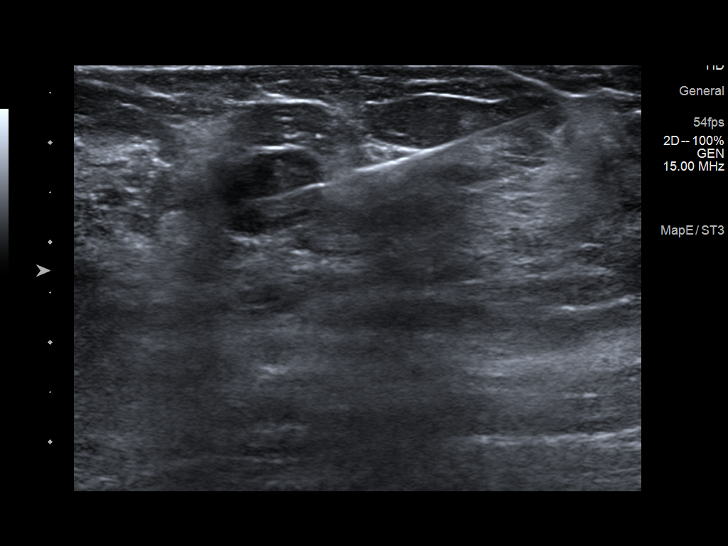
[im 7/14]
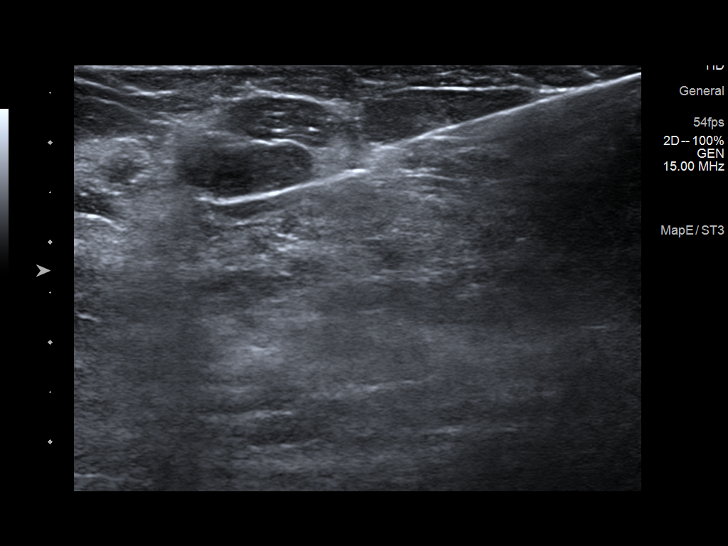
[im 8/14]
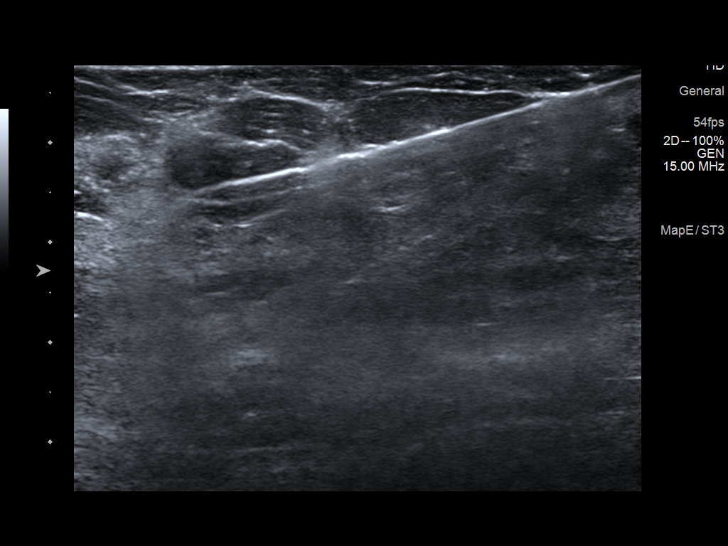
[im 9/14]
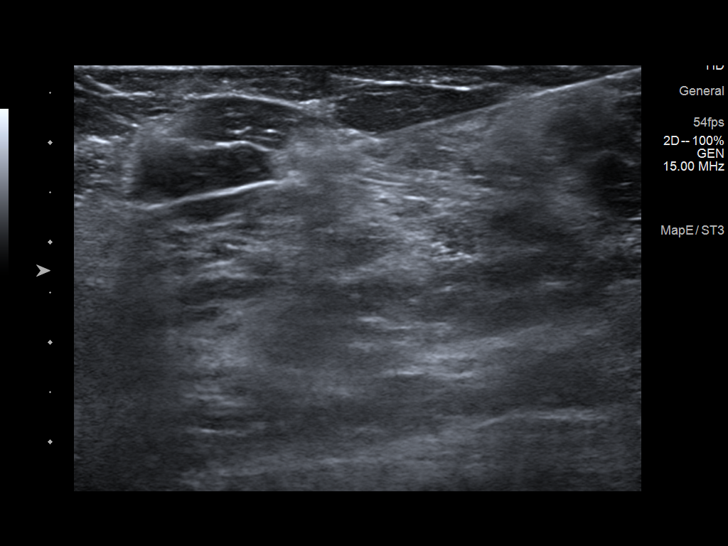
[im 10/14]
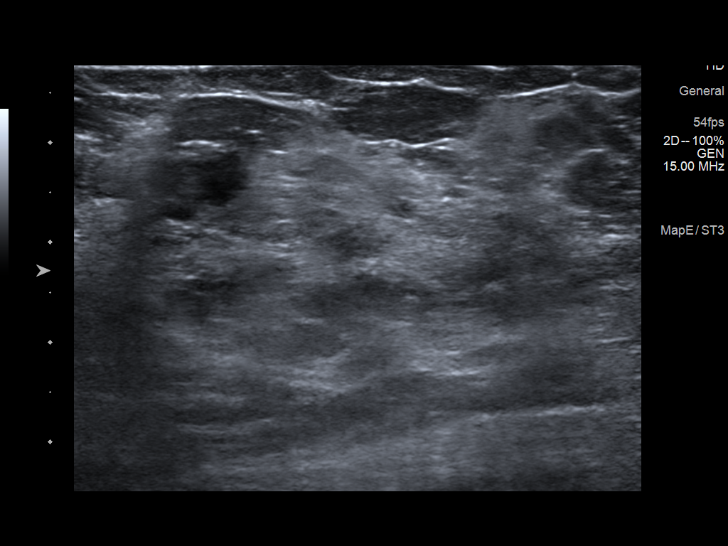
[im 12/14]
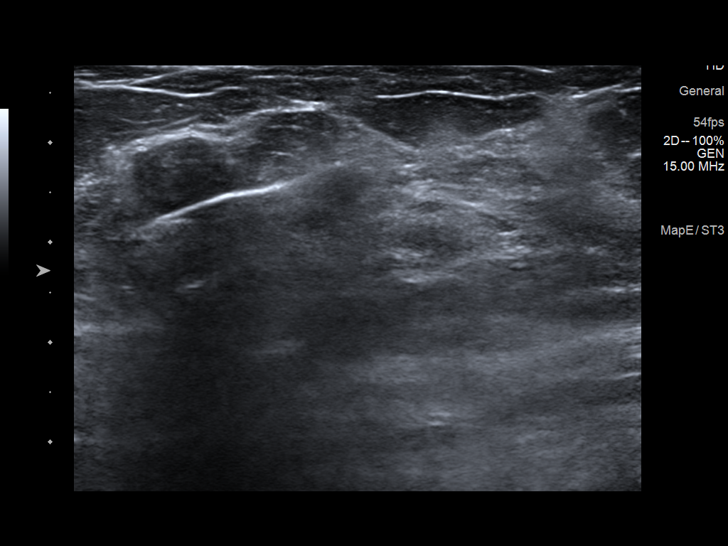
[im 13/14]
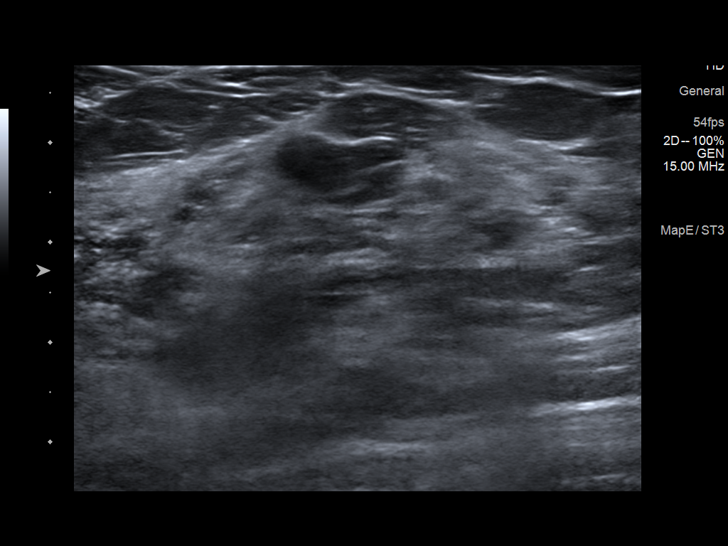
[im 14/14]
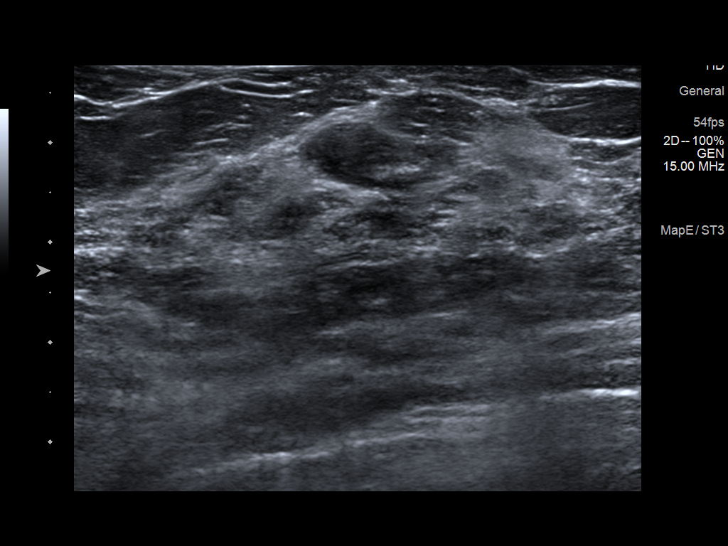

[12 of 14 positions shown; findings below may reference images not displayed]



Lesion quadrant: Upper outer quadrant

Using sterile technique and 1% Lidocaine as local anesthetic, under
direct ultrasound visualization, a 12 gauge Qosimov device was
used to perform biopsy of the LEFT breast mass at the 2 o'clock axis
using a inferolateral approach. At the conclusion of the procedure
ribbon shaped tissue marker clip was deployed into the biopsy
cavity. Follow up 2 view mammogram was performed and dictated
separately.
IMPRESSION: Ultrasound guided biopsy of the LEFT breast mass at the 2 o'clock
axis. No apparent complications.

ADDENDUM:
Pathology revealed MAMMARY CARCINOMA IN SITU, FIBROEPITHELIAL LESION
of the LEFT breast, 2 o'clock, 2 cmfn, (ribbon clip).
Immunohistochemical stain for E-cadherin is negative in the tumor
cells, consistent with a lobular phenotype Differential diagnosis
for fibroepithelial lesion includes a fibroadenoma and a fibrotic
intraductal papilloma. This was found to be concordant by Dr. Assistix
Farhadur, with excision recommended.

Pathology results were discussed with the patient by telephone. The
patient reported doing well after the biopsy with minimal tenderness
at the site. Post biopsy instructions and care were reviewed and
questions were answered. The patient was encouraged to call The

The patient wishes to discuss biopsy results and a surgical referral
with her physician before proceeding. The patient will contact me
when she is ready to proceed. My direct phone number was provided.

Consider MRI due to the complexity of the patient's breast tissue
and the multiplicity of sonographic findings. Stereotactic biopsy
would also be recommended for the LEFT breast posterior asymmetry
after the MRI.

Pathology results reported by Zilas Alge, RN on 05/23/2022.



Lesion quadrant: Upper outer quadrant

Using sterile technique and 1% Lidocaine as local anesthetic, under
direct ultrasound visualization, a 12 gauge Qosimov device was
used to perform biopsy of the LEFT breast mass at the 2 o'clock axis
using a inferolateral approach. At the conclusion of the procedure
ribbon shaped tissue marker clip was deployed into the biopsy
cavity. Follow up 2 view mammogram was performed and dictated
separately.
IMPRESSION: Ultrasound guided biopsy of the LEFT breast mass at the 2 o'clock
axis. No apparent complications.

## 2023-07-03 IMAGING — MG MM BREAST LOCALIZATION CLIP
4 series · 4 of 12 positions shown · non-contrast
Comparison: Previous exam(s).

CLINICAL DATA: Status post ultrasound-guided biopsy for a LEFT
breast mass at the 2 o'clock axis.

EXAM:
3D DIAGNOSTIC LEFT MAMMOGRAM POST ULTRASOUND BIOPSY

[L ML synth-2D]
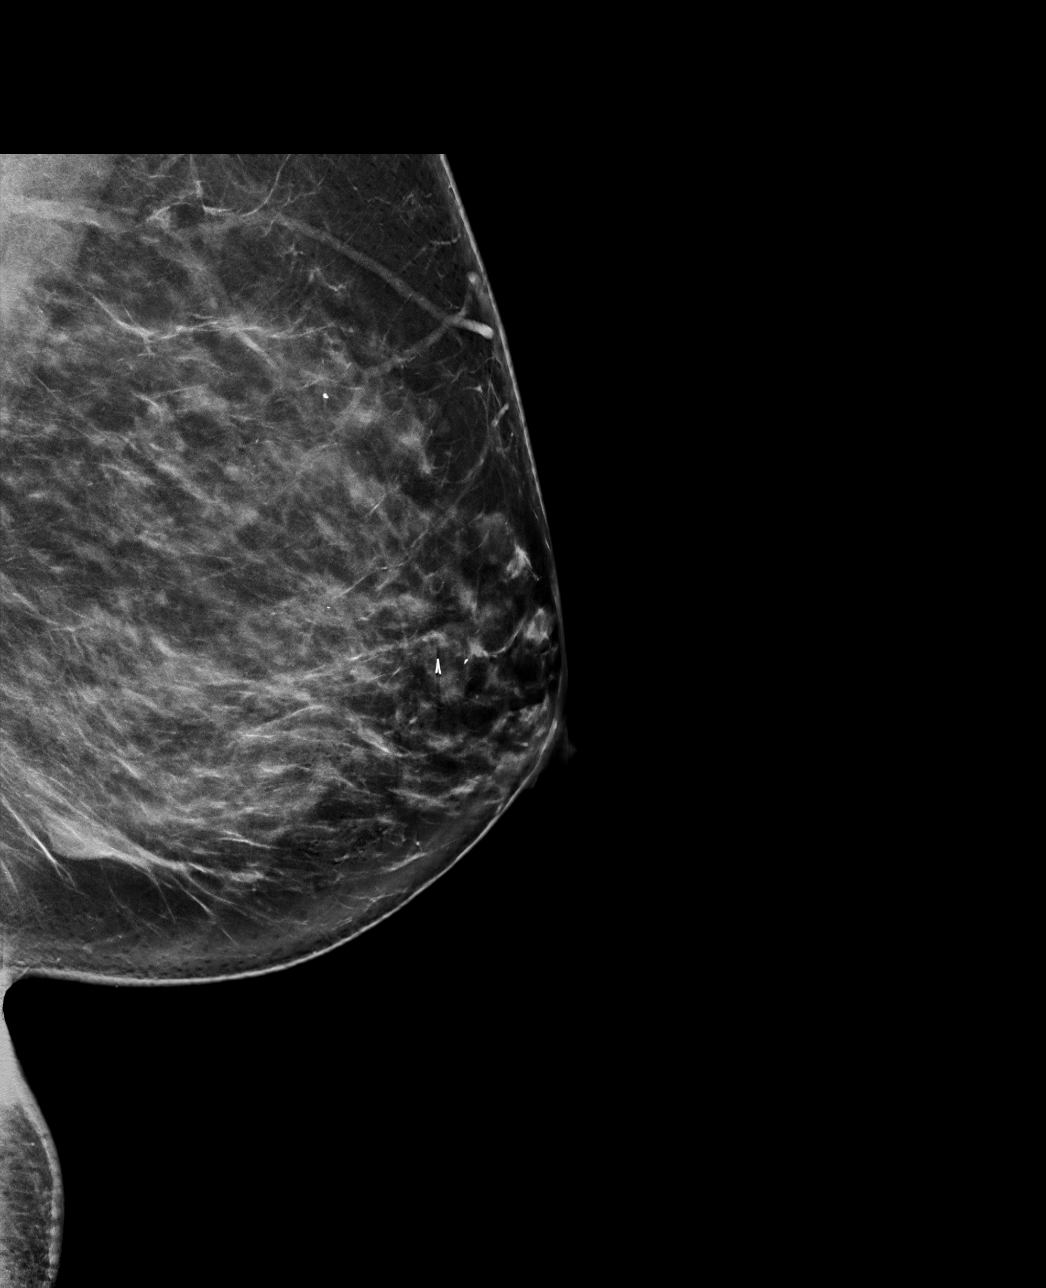

[L CC synth-2D]
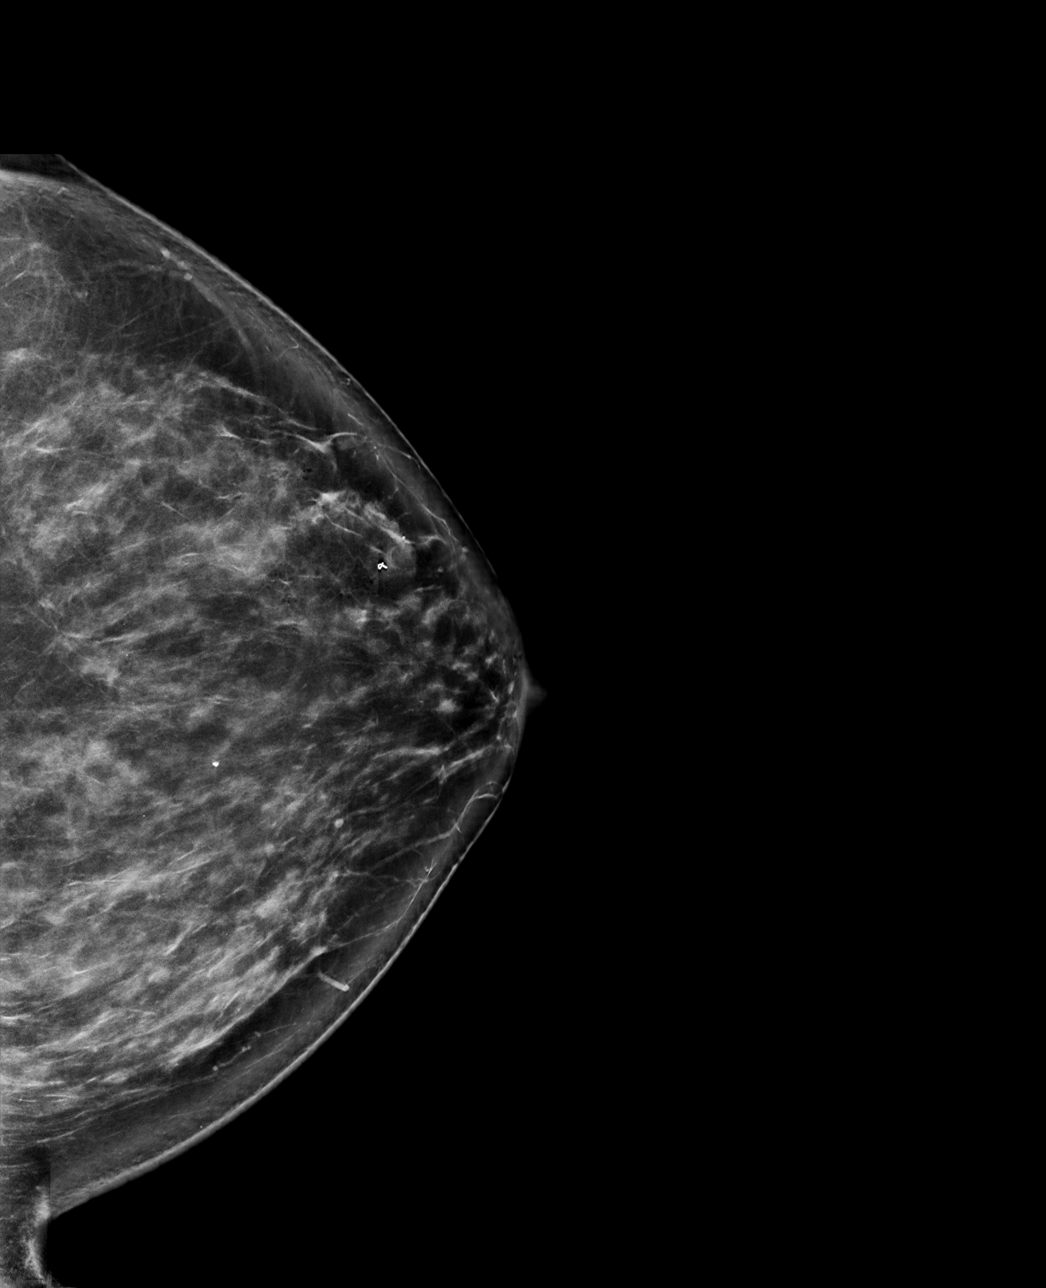

[L CC tomo · tomo slice 47/94.0]
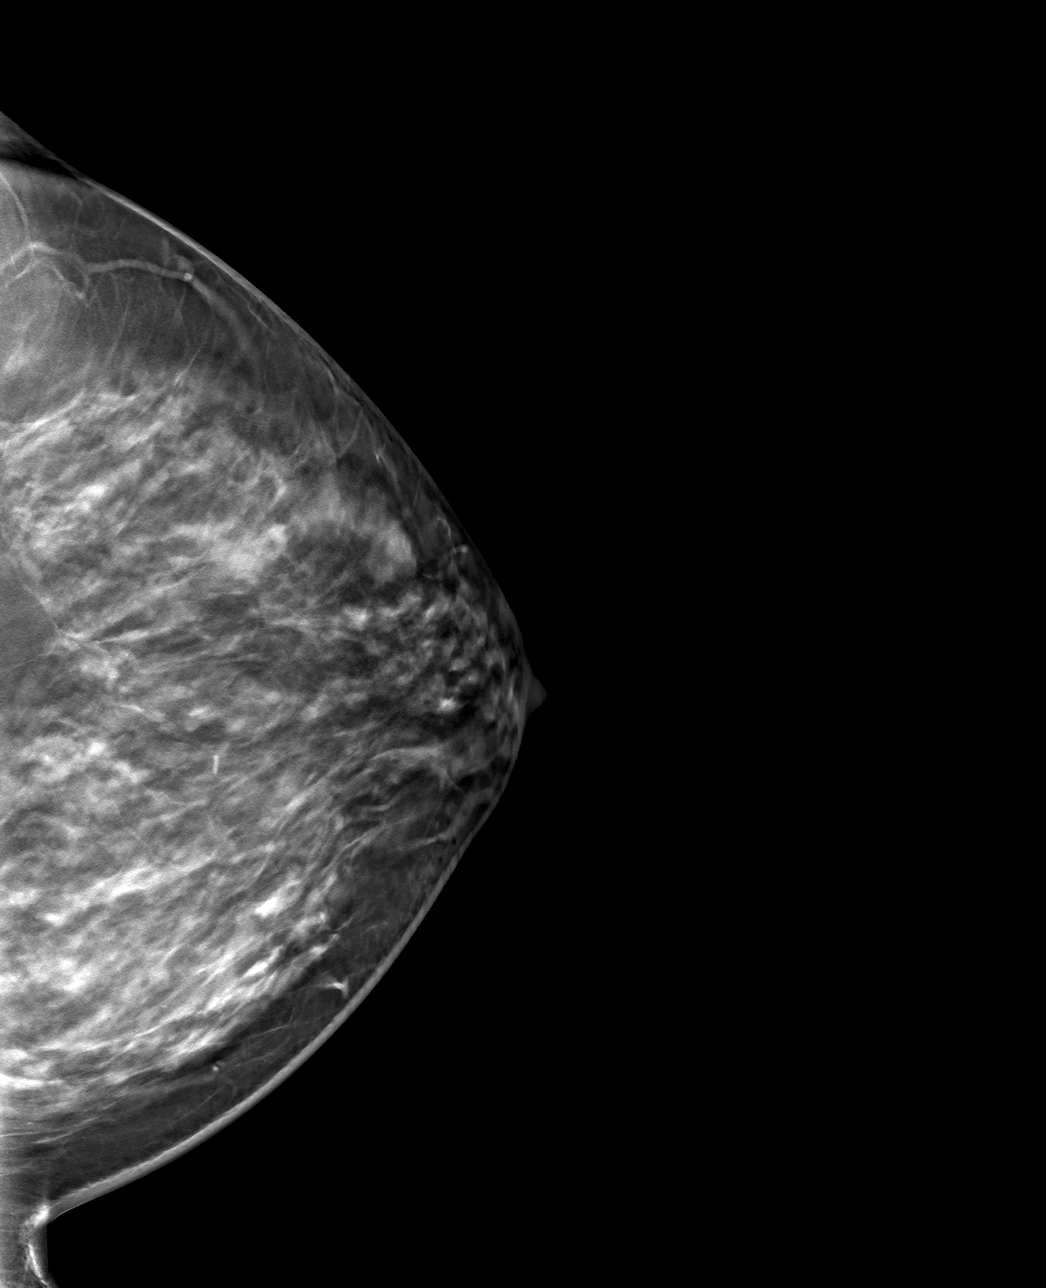

[L ML tomo · tomo slice 47/92.0]
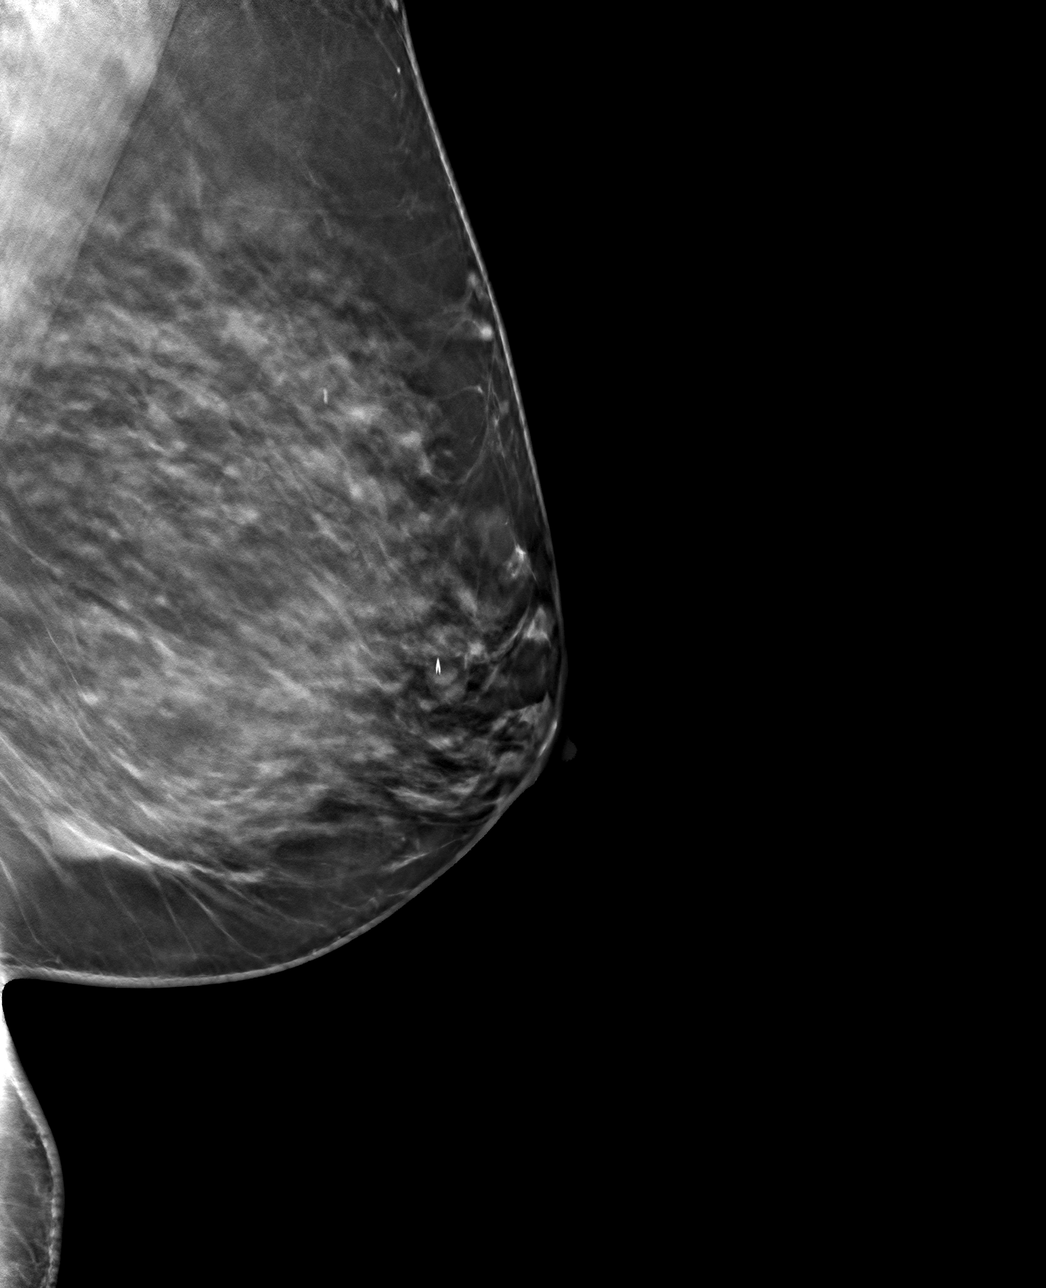

[4 of 12 positions shown; findings below may reference images not displayed]

FINDINGS: 3D Mammographic images were obtained following ultrasound guided
biopsy of the LEFT breast mass at the 2 o'clock axis. The biopsy
marking clip is in expected position at the site of biopsy.
IMPRESSION: Appropriate positioning of the ribbon shaped biopsy marking clip at
the site of biopsy in the upper-outer quadrant of the LEFT breast
corresponding to the targeted mass at the 2 o'clock axis.

Final Assessment: Post Procedure Mammograms for Marker Placement

## 2023-12-05 ENCOUNTER — Encounter: Payer: BC Managed Care – PPO | Admitting: Internal Medicine

## 2024-01-21 ENCOUNTER — Encounter: Payer: BC Managed Care – PPO | Admitting: Internal Medicine
